# Patient Record
Sex: Male | Born: 1975 | Race: White | Hispanic: No | Marital: Single | Smoking: Never smoker
Health system: Southern US, Community
[De-identification: ages and names within clinical notes are randomized; demographics above are authoritative.]

## PROBLEM LIST (undated history)

## (undated) DIAGNOSIS — E785 Hyperlipidemia, unspecified: Secondary | ICD-10-CM

## (undated) DIAGNOSIS — R1310 Dysphagia, unspecified: Secondary | ICD-10-CM

## (undated) DIAGNOSIS — Z8249 Family history of ischemic heart disease and other diseases of the circulatory system: Secondary | ICD-10-CM

## (undated) DIAGNOSIS — J45909 Unspecified asthma, uncomplicated: Secondary | ICD-10-CM

## (undated) HISTORY — DX: Hyperlipidemia, unspecified: E78.5

## (undated) HISTORY — DX: Dysphagia, unspecified: R13.10

## (undated) HISTORY — DX: Family history of ischemic heart disease and other diseases of the circulatory system: Z82.49

## (undated) HISTORY — PX: WISDOM TOOTH EXTRACTION: SHX21

## (undated) HISTORY — DX: Unspecified asthma, uncomplicated: J45.909

---

## 2010-06-08 ENCOUNTER — Emergency Department (HOSPITAL_BASED_OUTPATIENT_CLINIC_OR_DEPARTMENT_OTHER)
Admission: EM | Admit: 2010-06-08 | Discharge: 2010-06-08 | Payer: Self-pay | Source: Home / Self Care | Admitting: Emergency Medicine

## 2016-06-01 ENCOUNTER — Telehealth: Payer: Self-pay | Admitting: Cardiology

## 2016-06-01 NOTE — Telephone Encounter (Signed)
Records received from [facility] for apt on 06/26/16 with Dr SwazilandJordan. Records filed in medical records for schedules. CN

## 2016-06-25 NOTE — Progress Notes (Signed)
Cardiology Office Note    Date:  06/26/2016   ID:  Patrick ParentsCory Economou, DOB 05/21/1976, MRN [id number]  PCP:  Lilia ArgueKAPLAN,KRISTEN, PA-C  Cardiologist:  Tallyn Holroyd [place]az[place]Jordan, MD    History of Present Illness:  Patrick Horton is a 41 y.o. male seen at the request of Mady [facility]Kristen [facility] for evaluation of family history of prolonged QT. He is in good health with only history of hypertriglyceridemia. He reports a family history of prolonged QT. This was diagnosed in his half sister with genetic testing for another condition. Since then multiple family members have been tested. ? With buccal swab. He reports his mother, brother, half sister- and her two children have tested positive. 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 He states it has never shown up on their Ecgs. There are no clinical events noted. No family history of sudden death or arrhythmia. States his mother was placed on a beta blocker but this made her feel awful. He wants to avoid medication if possible.   Past Medical History:  Diagnosis Date  . Asthma   . Asthma   . Family history of long QT syndrome   . Hyperlipidemia   . Swallowing dysfunction     Past Surgical History:  Procedure Laterality Date  . WISDOM TOOTH EXTRACTION      Current Medications: Outpatient Medications Prior to Visit  Medication Sig Dispense Refill  . albuterol (PROVENTIL HFA;VENTOLIN HFA) 108 (90 Base) MCG/ACT inhaler Inhale 2 puffs into the lungs every 4 (four) hours as needed for wheezing or shortness of breath.     No facility-administered medications prior to visit.      Allergies:   Penicillins   Social History   Social History  . Marital status: Single    Spouse name: N/A  . Number of children: N/A  . Years of education: N/A   Social History Main Topics  . Smoking status: Never Smoker  . Smokeless tobacco: Never Used  . Alcohol use None  . Drug use: Unknown  . Sexual activity: Not Asked   Other Topics Concern  . None   Social  History Narrative  . None     Family History:  The patient's family history includes Heart attack in his maternal grandfather. Positive family history for long QT.  ROS:   Please see the history of present illness.    ROS All other systems reviewed and are negative.   PHYSICAL EXAM:   VS:  BP 120/80 (BP Location: Left Arm, Patient Position: Sitting, Cuff Size: Normal)   Pulse 69   Ht 5\' 9"  (1.753 m)   Wt 178 lb 6.4 oz (80.9 kg)   BMI 26.35 kg/m    GEN: Well nourished, well developed, in no acute distress  HEENT: normal  Neck: no JVD, carotid bruits, or masses Cardiac: RRR; no murmurs, rubs, or gallops,no edema  Respiratory:  clear to auscultation bilaterally, normal work of breathing GI: soft, nontender, nondistended, + BS MS: no deformity or atrophy  Skin: warm and dry, no rash Neuro:  Alert and Oriented x 3, Strength and sensation are intact Psych: euthymic mood, full affect  Wt Readings from Last 3 Encounters:  06/26/16 178 lb 6.4 oz (80.9 kg)      Studies/Labs Reviewed:   EKG:  EKG is ordered today.  The ekg ordered today demonstrates NSR with normal Ecg. QTc of 385 msec.  Recent Labs: No results found for requested labs within last 8760 hours.  Lipid Panel No results found for: CHOL, TRIG, HDL, CHOLHDL, VLDL, LDLCALC, LDLDIRECT  Additional studies/ records that were reviewed today include:  Labs dated 05/15/16: TSH normal. Cholesterol 175, triglycerides 297, HDL 35, LDL 97. CMET and CBC normal.  ASSESSMENT:    1. Hypertriglyceridemia   2. Family history of prolonged QT   PLAN:  In order of problems listed above:    The patient would like to have genetic testing for prolonged QT. It would be helpful to know what mutation his family members have tested positive for. The patient has a normal QT on Ecg today. I have reached out to Dr. Berton Mount to give [place] some guidance on how to proceed.      Medication Adjustments/Labs and Tests Ordered: Current  medicines are reviewed at length with the patient today.  Concerns regarding medicines are outlined above.  Medication changes, Labs and Tests ordered today are listed in the Patient Instructions below. Patient Instructions  I will discuss your case with our EP and call you back about next steps for testing.      Signed, Marquez Ceesay [place], MD  06/26/2016 12:09 PM    [facility] [address] [place], [place], [address] [phone]

## 2016-06-26 ENCOUNTER — Encounter: Payer: Self-pay | Admitting: Cardiology

## 2016-06-26 ENCOUNTER — Ambulatory Visit (INDEPENDENT_AMBULATORY_CARE_PROVIDER_SITE_OTHER): Payer: BLUE CROSS/BLUE SHIELD | Admitting: Cardiology

## 2016-06-26 DIAGNOSIS — E781 Pure hyperglyceridemia: Secondary | ICD-10-CM | POA: Diagnosis not present

## 2016-06-26 NOTE — Patient Instructions (Signed)
I will discuss your case with our EP and call you back about next steps for testing.

## 2016-07-10 ENCOUNTER — Telehealth: Payer: Self-pay

## 2016-07-10 NOTE — Telephone Encounter (Signed)
Called patient no answer.LMTC. 

## 2016-07-14 ENCOUNTER — Encounter: Payer: Self-pay | Admitting: Internal Medicine

## 2016-07-21 ENCOUNTER — Telehealth: Payer: Self-pay

## 2016-07-21 NOTE — Telephone Encounter (Signed)
Spoke to BorgWarnerMelissa Tatum EP scheduler, she stated she called patient and left a message for him to call her back to schedule appointment with Dr.Klein.Patient  never returned her call.

## 2016-07-28 ENCOUNTER — Institutional Professional Consult (permissible substitution): Payer: BLUE CROSS/BLUE SHIELD | Admitting: Internal Medicine

## 2016-07-30 ENCOUNTER — Institutional Professional Consult (permissible substitution): Payer: BLUE CROSS/BLUE SHIELD | Admitting: Internal Medicine

## 2016-08-04 ENCOUNTER — Encounter (INDEPENDENT_AMBULATORY_CARE_PROVIDER_SITE_OTHER): Payer: Self-pay

## 2016-08-04 ENCOUNTER — Encounter: Payer: Self-pay | Admitting: Internal Medicine

## 2016-08-04 ENCOUNTER — Ambulatory Visit (INDEPENDENT_AMBULATORY_CARE_PROVIDER_SITE_OTHER): Payer: BLUE CROSS/BLUE SHIELD | Admitting: Internal Medicine

## 2016-08-04 VITALS — BP 118/84 | HR 65 | Ht 68.0 in | Wt 183.0 lb

## 2016-08-04 DIAGNOSIS — Z8249 Family history of ischemic heart disease and other diseases of the circulatory system: Secondary | ICD-10-CM | POA: Diagnosis not present

## 2016-08-04 NOTE — Progress Notes (Signed)
ELECTROPHYSIOLOGY CONSULT NOTE  Patient ID: Patrick Horton, MRN: [id number], DOB/AGE: [birth year] 41 y.o. Admit date: (Not on file) Date of Consult: 08/04/2016  Primary Physician: Lilia Argue Primary Cardiologist: PJ Consulting Physician PJ  Chief Complaint: Family hx of LQTS   HPI Patrick Horton is a 41 y.o. male    He apparently has a family history of prolonged QT. This was initially diagnosed in a half-sister's daughter . Subsequently mother his brother and his half brother and his half sister and her other daughter have been tested positive.They have LQT1 mutation through [facility]   The mother is taking BB  There are no attirbutable symptoms in the family The patient himslef  has had no symptomsl     Past Medical History:  Diagnosis Date  . Asthma   . Asthma   . Family history of long QT syndrome   . Hyperlipidemia   . Swallowing dysfunction       Surgical History:  Past Surgical History:  Procedure Laterality Date  . WISDOM TOOTH EXTRACTION       Home Meds: Prior to Admission medications   Medication Sig Start Date End Date Taking? Authorizing Provider  aspirin EC 81 MG tablet Take 81 mg by mouth daily.    Historical Provider, MD    Allergies:  Allergies  Allergen Reactions  . Penicillins Rash    Childhood reaction    Social History   Social History  . Marital status: Single    Spouse name: N/A  . Number of children: N/A  . Years of education: N/A   Occupational History  . Not on file.   Social History Main Topics  . Smoking status: Never Smoker  . Smokeless tobacco: Never Used  . Alcohol use Not on file  . Drug use: Unknown  . Sexual activity: Not on file   Other Topics Concern  . Not on file   Social History Narrative  . No narrative on file     Family History  Problem Relation Age of Onset  . Long QT syndrome Mother   . Heart attack Maternal Grandfather   . Long QT syndrome Brother      ROS:  Please see the history of  present illness.     All other systems reviewed and negative.    Physical Exam: Blood pressure 118/84, pulse 65, height 5\' 8"  (1.727 m), weight 183 lb (83 kg), SpO2 98 %. General: Well developed, well nourished male in no acute distress. Head: Normocephalic, atraumatic, sclera non-icteric, no xanthomas, nares are without discharge. EENT: normal  Lymph Nodes:  none Neck: Negative for carotid bruits. JVD not elevated. Back:without scoliosis kyphosis   Lungs: Clear bilaterally to auscultation without wheezes, rales, or rhonchi. Breathing is unlabored. Heart: RRR with S1 S2. No  murmur . No rubs, or gallops appreciated. Abdomen: Soft, non-tender, non-distended with normoactive bowel sounds. No hepatomegaly. No rebound/guarding. No obvious abdominal masses. Msk:  Strength and tone appear normal for age. Extremities: No clubbing or cyanosis. No  edema.  Distal pedal pulses are 2+ and equal bilaterally. Skin: Warm and Dry Neuro: Alert and oriented X 3. CN III-XII intact Grossly normal sensory and motor function . Psych:  Responds to questions appropriately with a normal affect.      Labs: Cardiac Enzymes No results for input(s): CKTOTAL, CKMB, TROPONINI in the last 72 hours. CBC No results found for: WBC, HGB, HCT, MCV, PLT PROTIME: No results for input(s): LABPROT, INR in the last  72 hours. Chemistry No results for input(s): NA, K, CL, CO2, BUN, CREATININE, CALCIUM, PROT, BILITOT, ALKPHOS, ALT, AST, GLUCOSE in the last 168 hours.  Invalid input(s): LABALBU Lipids No results found for: CHOL, HDL, LDLCALC, TRIG BNP No results found for: PROBNP Thyroid Function Tests: No results for input(s): TSH, T4TOTAL, T3FREE, THYROIDAB in the last 72 hours.  Invalid input(s): FREET3 Miscellaneous No results found for: DDIMER  Radiology/Studies:  No results found.  WRU:[id number]:Personally reviewed   sinus 71 15/08/35 Ow normal   Assessment and Plan:   Family hx LQTS  The family gene is known  and the patient desires to be tested  In the interim I have advised him of the [employer] website for both him and the extended family  I have been in touch w [facility] and we will proceed testing    Recommendations are the patient's symptomatic or asymptomatic to be treated with beta blockers  most effective or thought to be nadolol and propranolol   Sherryl MangesSteven Sevastian Witczak

## 2016-08-04 NOTE — Patient Instructions (Signed)
Medication Instructions: - Your physician recommends that you continue on your current medications as directed. Please refer to the Current Medication list given to you today.  Labwork: - none ordered  Procedures/Testing: - none ordered  Follow-Up: - we will call you when we hear back from [facility] to arrange testing for you  Any Additional Special Instructions Will Be Listed Below (If Applicable).     If you need a refill on your cardiac medications before your next appointment, please call your pharmacy.

## 2018-03-21 ENCOUNTER — Ambulatory Visit
Admission: RE | Admit: 2018-03-21 | Discharge: 2018-03-21 | Disposition: A | Discharge status: Home / Self Care | Payer: BLUE CROSS/BLUE SHIELD | Source: Ambulatory Visit | Attending: Family Medicine | Admitting: Family Medicine

## 2018-03-21 ENCOUNTER — Other Ambulatory Visit: Payer: Self-pay | Admitting: Family Medicine

## 2018-03-21 DIAGNOSIS — R52 Pain, unspecified: Secondary | ICD-10-CM

## 2019-10-22 IMAGING — CR DG CHEST 2V
2 series · 2 of 2 positions shown · non-contrast
Comparison: None.

CLINICAL DATA: Right chest pain x6 months

EXAM:
CHEST - 2 VIEW

[w chest pa]
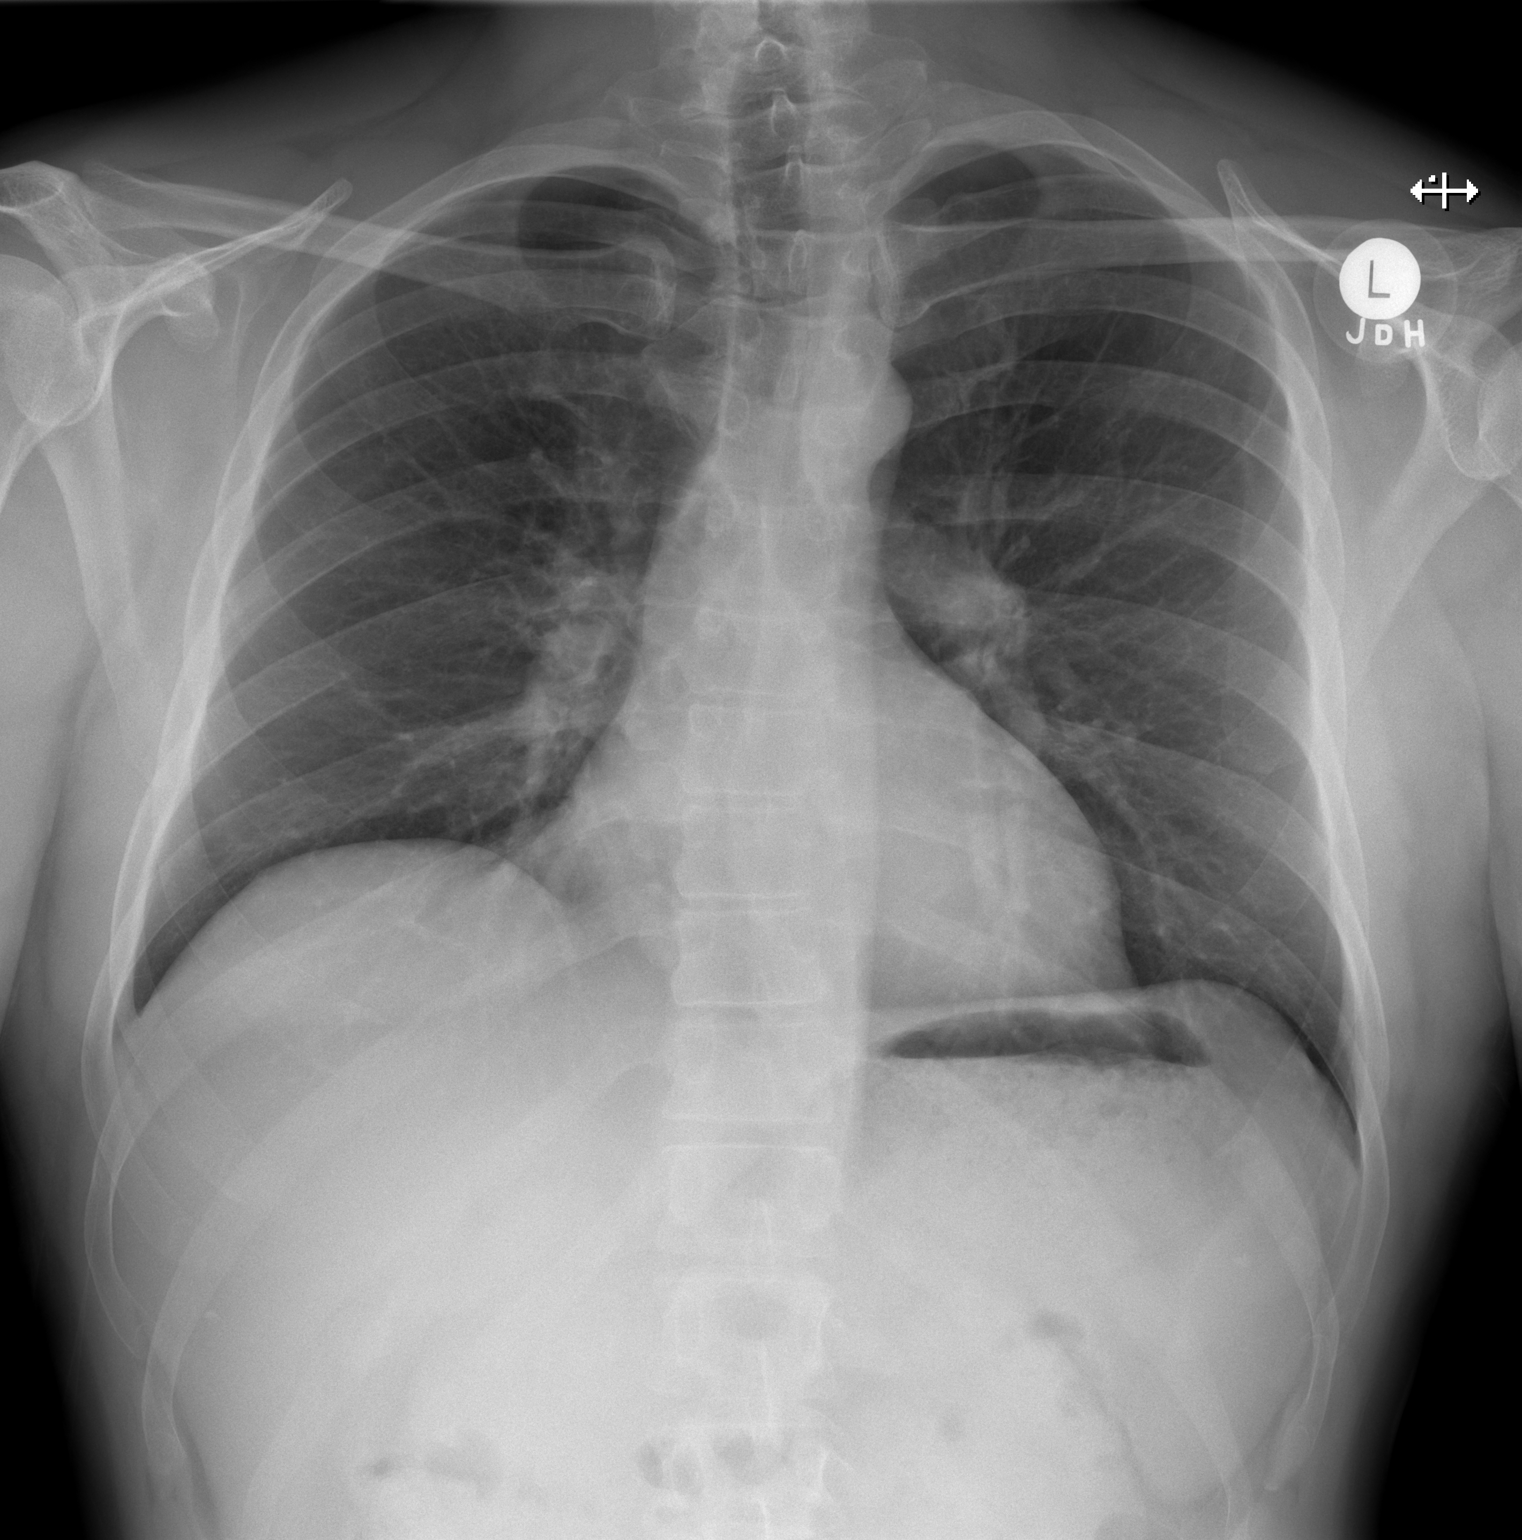

[w chest lat]
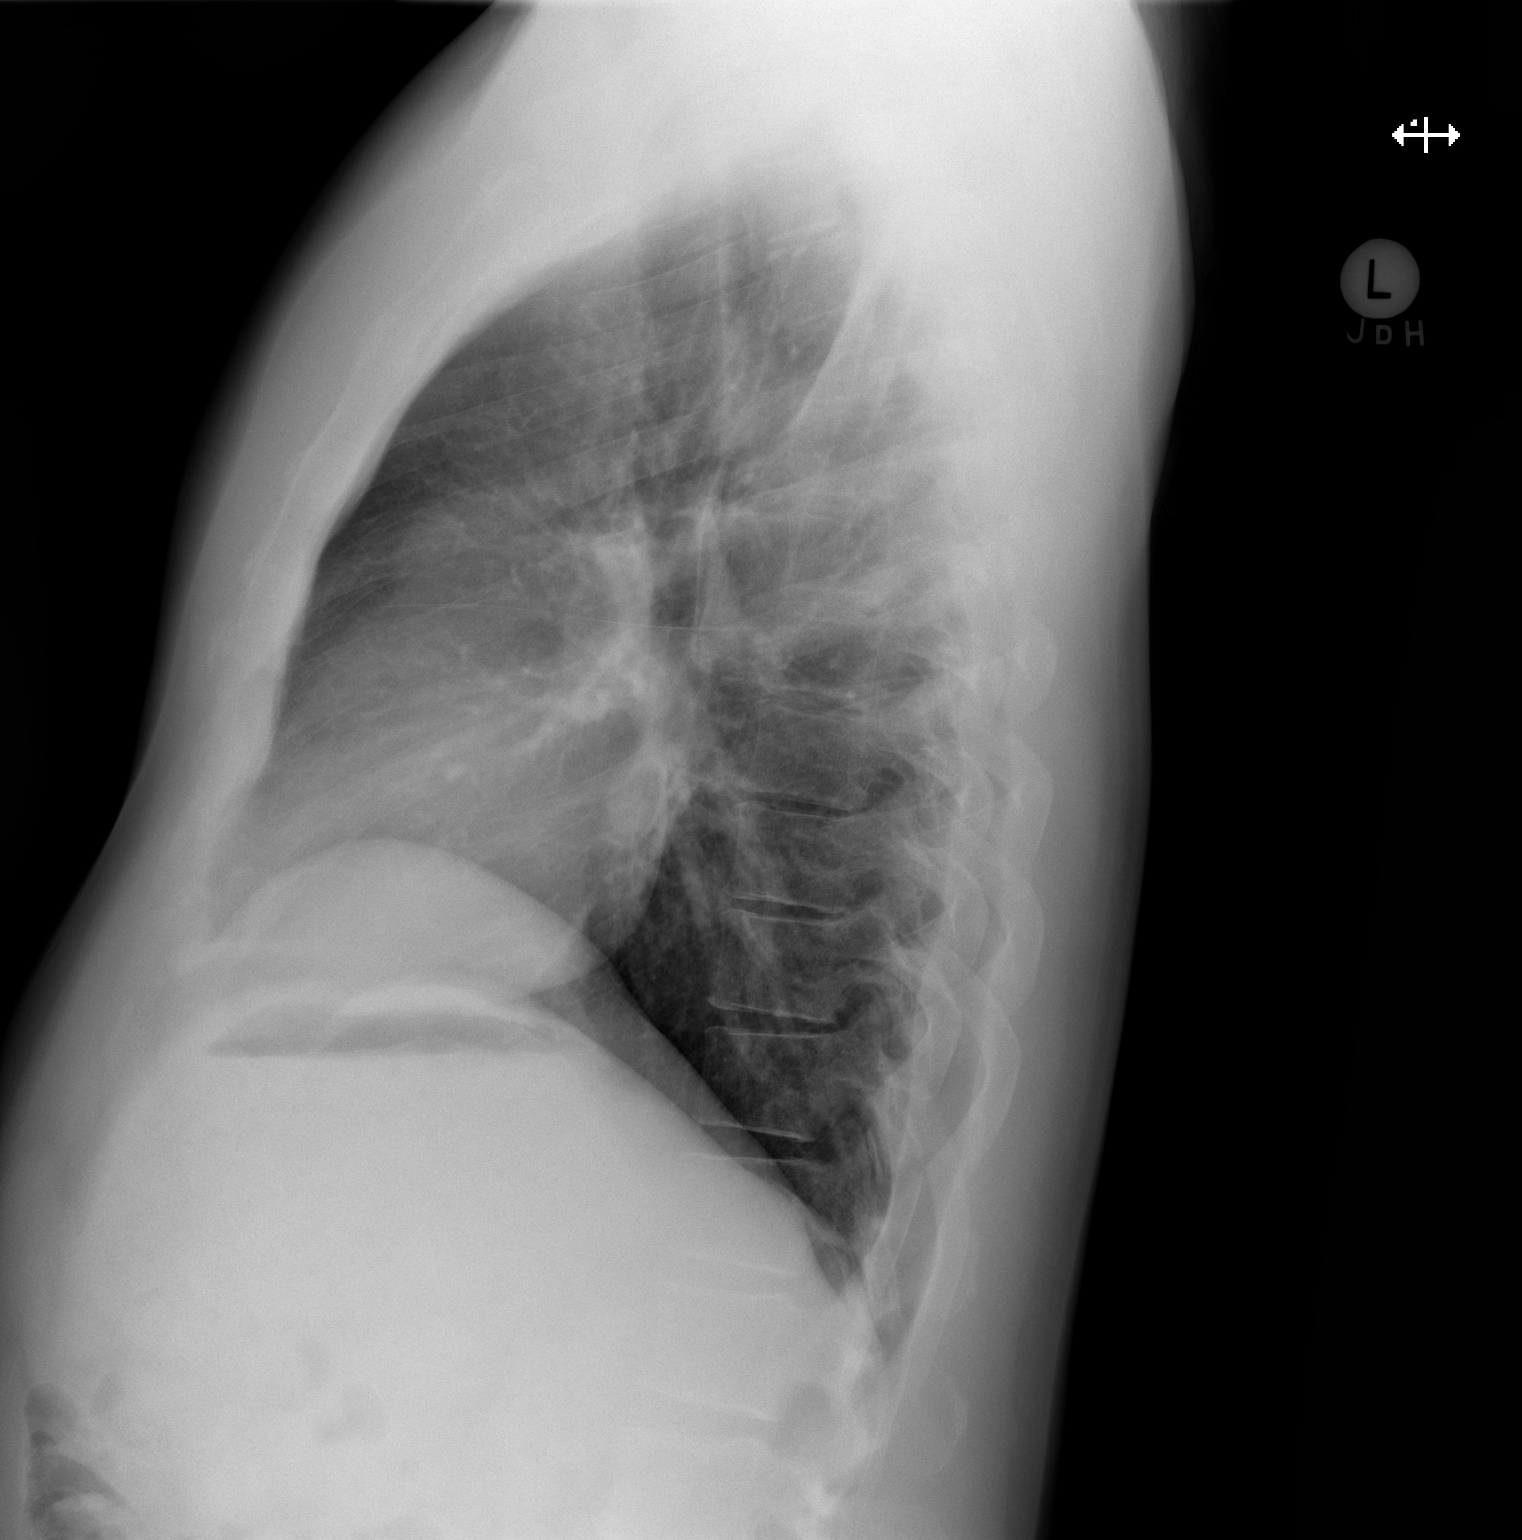

[2 of 2 positions shown; findings below may reference images not displayed]

FINDINGS: Lungs are clear.  No pleural effusion or pneumothorax.

The heart is normal in size.

Visualized osseous structures are within normal limits.
IMPRESSION: Normal chest radiographs.

## 2022-11-10 ENCOUNTER — Ambulatory Visit (INDEPENDENT_AMBULATORY_CARE_PROVIDER_SITE_OTHER): Payer: BC Managed Care – PPO | Admitting: Orthopaedic Surgery

## 2022-11-10 DIAGNOSIS — S76312A Strain of muscle, fascia and tendon of the posterior muscle group at thigh level, left thigh, initial encounter: Secondary | ICD-10-CM | POA: Diagnosis not present

## 2022-11-10 NOTE — Progress Notes (Signed)
Office Visit Note   Patient: Patrick Horton           Date of Birth: March 24, 1976           MRN: [id number] Visit Date: 11/10/2022              Requested by: Patrick Horton., PA-C [address]  [place] [address] PCP: Patrick Horton., PA-C   Assessment & Plan: Visit Diagnoses:  1. Left proximal hamstring tendon rupture, initial encounter     Plan: Impression is left proximal hamstring rupture.  The MRI scan and the report were both reviewed in detail.  He has a 3 cm avulsion of the conjoined tendon 30% tear of the semimembranosus.  He is doing very well clinically.  Based on findings I recommended nonoperative treatment.  He is allowed to do daily activities for the next 6 weeks.  He will be careful and modify activity as tolerated.  Work note with restrictions provided for 3 months.  I would like to recheck him in 6 weeks.  Follow-Up Instructions: Return in about 6 weeks (around 12/22/2022).   Orders:  No orders of the defined types were placed in this encounter.  No orders of the defined types were placed in this encounter.     Procedures: No procedures performed   Clinical Data: No additional findings.   Subjective: Chief Complaint  Patient presents with   Left Leg - Pain    HPI Patrick Horton is a 47 year old gentleman comes in for evaluation of acute left proximal hamstring rupture that occurred this past Sunday while playing tennis.  He was unable to complete the match.  He felt pain in the buttock area and fell to the ground.  He was initially evaluated by Patrick Horton at [facility] and an MRI confirmed proximal hamstring rupture.  He describes a dull ache and a slight limp with ambulation.  Denies any numbness and tingling.  He has been taking Aleve and Advil. Review of Systems  Constitutional: Negative.   HENT: Negative.    Eyes: Negative.   Respiratory: Negative.    Cardiovascular: Negative.   Gastrointestinal: Negative.   Endocrine: Negative.    Genitourinary: Negative.   Skin: Negative.   Allergic/Immunologic: Negative.   Neurological: Negative.   Hematological: Negative.   Psychiatric/Behavioral: Negative.    All other systems reviewed and are negative.    Objective: Vital Signs: There were no vitals taken for this visit.  Physical Exam Vitals and nursing note reviewed.  Constitutional:      Appearance: He is well-developed.  HENT:     Head: Normocephalic and atraumatic.  Eyes:     Pupils: Pupils are equal, round, and reactive to light.  Pulmonary:     Effort: Pulmonary effort is normal.  Abdominal:     Palpations: Abdomen is soft.  Musculoskeletal:        General: Normal range of motion.     Cervical back: Neck supple.  Skin:    General: Skin is warm.  Neurological:     Mental Status: He is alert and oriented to person, place, and time.  Psychiatric:        Behavior: Behavior normal.        Thought Content: Thought content normal.        Judgment: Judgment normal.     Ortho Exam Examination of the left lower extremity shows that he is able to flex his knee against gravity to about 30 degrees without pain from the  prone position.  There is very light bruising to the popliteal fossa.  No neurovascular compromise to the distal extremity.  He has mild discomfort with palpation towards the proximal hamstring.  Hip range of motion is well-tolerated.  Overall he is able to walk without any assistive devices and he has a minimal limp that is almost imperceptible. Specialty Comments:  No specialty comments available.  Imaging: No results found.   PMFS History: Patient Active Problem List   Diagnosis Date Noted   Left proximal hamstring tendon rupture, initial encounter 11/10/2022   Hypertriglyceridemia 06/26/2016   Past Medical History:  Diagnosis Date   Asthma    Asthma    Family history of long QT syndrome    Hyperlipidemia    Swallowing dysfunction     Family History  Problem Relation Age of  Onset   Long QT syndrome Mother    Heart attack Maternal Grandfather    Long QT syndrome Brother     Past Surgical History:  Procedure Laterality Date   WISDOM TOOTH EXTRACTION     Social History   Occupational History   Not on file  Tobacco Use   Smoking status: Never   Smokeless tobacco: Never  Substance and Sexual Activity   Alcohol use: Not on file   Drug use: Not on file   Sexual activity: Not on file

## 2022-12-24 ENCOUNTER — Ambulatory Visit (INDEPENDENT_AMBULATORY_CARE_PROVIDER_SITE_OTHER): Payer: BC Managed Care – PPO | Admitting: Orthopaedic Surgery

## 2022-12-24 DIAGNOSIS — S76312A Strain of muscle, fascia and tendon of the posterior muscle group at thigh level, left thigh, initial encounter: Secondary | ICD-10-CM

## 2022-12-24 NOTE — Progress Notes (Signed)
   Office Visit Note   Patient: Patrick Horton           Date of Birth: [birth year]           MRN: [id number] Visit Date: 12/24/2022              Requested by: Richmond Campbell., PA-C [address]  [place] [address] PCP: Richmond Campbell., PA-C   Assessment & Plan: Visit Diagnoses:  1. Left proximal hamstring tendon rupture, initial encounter     Plan: Patrick Horton is recovering well from the injury.  He is about 6 to 7 weeks.  Clinically very happy with his progress.  At this point I will make referral to the [facility] for stretching and strengthening and gait training.  Recheck in 6 weeks.  Follow-Up Instructions: Return in about 6 weeks (around 02/04/2023).   Orders:  Orders Placed This Encounter  Procedures   Ambulatory referral to Physical Therapy   No orders of the defined types were placed in this encounter.     Procedures: No procedures performed   Clinical Data: No additional findings.   Subjective: Chief Complaint  Patient presents with   Left Leg - Follow-up    HPI Patrick Horton is now 7 weeks status post left proximal hamstring rupture.  He reports that overall he is doing much better.  He is able to sit longer and has been able to increase activity without significant difficulty.  He has been able to cross city street quickly. Review of Systems   Objective: Vital Signs: There were no vitals taken for this visit.  Physical Exam  Ortho Exam Examination of the left thigh shows no tenderness to the proximal hamstrings over the ischial tuberosity.  He has expected stiffness of the hamstring.  Normal gait pattern. Specialty Comments:  No specialty comments available.  Imaging: No results found.   PMFS History: Patient Active Problem List   Diagnosis Date Noted   Left proximal hamstring tendon rupture, initial encounter 11/10/2022   Hypertriglyceridemia 06/26/2016   Past Medical History:  Diagnosis Date   Asthma    Asthma    Family  history of long QT syndrome    Hyperlipidemia    Swallowing dysfunction     Family History  Problem Relation Age of Onset   Long QT syndrome Mother    Heart attack Maternal Grandfather    Long QT syndrome Brother     Past Surgical History:  Procedure Laterality Date   WISDOM TOOTH EXTRACTION     Social History   Occupational History   Not on file  Tobacco Use   Smoking status: Never   Smokeless tobacco: Never  Substance and Sexual Activity   Alcohol use: Not on file   Drug use: Not on file   Sexual activity: Not on file

## 2022-12-31 ENCOUNTER — Other Ambulatory Visit: Payer: Self-pay

## 2022-12-31 ENCOUNTER — Encounter (HOSPITAL_BASED_OUTPATIENT_CLINIC_OR_DEPARTMENT_OTHER): Payer: Self-pay | Admitting: Physical Therapy

## 2022-12-31 ENCOUNTER — Ambulatory Visit (HOSPITAL_BASED_OUTPATIENT_CLINIC_OR_DEPARTMENT_OTHER): Payer: BC Managed Care – PPO | Attending: Orthopaedic Surgery | Admitting: Physical Therapy

## 2022-12-31 DIAGNOSIS — M79605 Pain in left leg: Secondary | ICD-10-CM | POA: Diagnosis present

## 2022-12-31 DIAGNOSIS — S76312A Strain of muscle, fascia and tendon of the posterior muscle group at thigh level, left thigh, initial encounter: Secondary | ICD-10-CM | POA: Diagnosis not present

## 2022-12-31 DIAGNOSIS — R2689 Other abnormalities of gait and mobility: Secondary | ICD-10-CM | POA: Diagnosis present

## 2022-12-31 NOTE — Therapy (Signed)
OUTPATIENT PHYSICAL THERAPY LOWER EXTREMITY EVALUATION   Patient Name: Patrick Horton MRN: [id number] DOB:1975-07-29, 47 y.o., male Today's Date: 01/01/2023  END OF SESSION:  PT End of Session - 12/31/22 1539     Visit Number 1    Number of Visits 8    Date for PT Re-Evaluation 02/25/23    PT Start Time 1430    PT Stop Time 1515    PT Time Calculation (min) 45 min             Past Medical History:  Diagnosis Date   Asthma    Asthma    Family history of long QT syndrome    Hyperlipidemia    Swallowing dysfunction    Past Surgical History:  Procedure Laterality Date   WISDOM TOOTH EXTRACTION     Patient Active Problem List   Diagnosis Date Noted   Left proximal hamstring tendon rupture, initial encounter 11/10/2022   Hypertriglyceridemia 06/26/2016    PCP: Dr Mady Gemma   REFERRING PROVIDER: Dr Gershon Mussel   REFERRING DIAG:  Diagnosis  [id number] (ICD-10-CM) - Left proximal hamstring tendon rupture, initial encounter    THERAPY DIAG:  No diagnosis found.  Rationale for Evaluation and Treatment: Rehabilitation  ONSET DATE: aprox 8 weeks ago   SUBJECTIVE:   SUBJECTIVE STATEMENT: Patient was playing tennis approximately 8 weeks ago when he felt a pop in his proximal head of the hamstrings.  He had significant pain at that time.  For a period of time he had difficulty sitting standing and walking.  He is active prior to his injury.  He enjoys tennis and golf.  He also did a light jogging.  He feels like his ability to walk is improved.  He is also sitting better without pain.  Over the past 2 weeks or so he is only experienced about a 1-2 out of 10 pain.  He would like to progress back into normal activity.  He would also like to avoid surgery if able.  PERTINENT HISTORY: Bulging disc in the back  PAIN:  Are you having pain? Yes: NPRS scale: 1-2/10 Pain location: left gluteal  Pain description: dull aching  Aggravating factors: sitting for too long Relieving  factors: changing position   PRECAUTIONS: None  RED FLAGS: None   WEIGHT BEARING RESTRICTIONS: No  FALLS:  Has patient fallen in last 6 months? No  LIVING ENVIRONMENT: No steps into the house  OCCUPATION:  Insurance underwriter: mostly desk work for now  NIKE:   Tennis , golf, [employer]     PLOF: Independent  PATIENT GOALS:   NEXT MD VISIT:    OBJECTIVE:   DIAGNOSTIC FINDINGS:    PATIENT SURVEYS:  FOTO    COGNITION: Overall cognitive status: Within functional limits for tasks assessed     SENSATION: Occasional tingling   EDEMA:    MUSCLE LENGTH: Hamstrings: Right 20 deg from straight 90/90; Left 43 from straight deg   POSTURE: No Significant postural limitations  PALPATION: Mild TTP in the gluteal   LOWER EXTREMITY ROM:  Passive ROM Right eval Left eval  Hip flexion  Within normal limits  Hip extension    Hip abduction    Hip adduction    Hip internal rotation  Within normal limits  Hip external rotation  Within normal limits  Knee flexion    Knee extension    Ankle dorsiflexion    Ankle plantarflexion    Ankle inversion    Ankle eversion     (Blank  rows = not tested)  LOWER EXTREMITY MMT:  MMT Right eval Left eval  Hip flexion 40.7 38.8  Hip extension    Hip abduction 45.6 47.7  Hip adduction    Hip internal rotation    Hip external rotation    Knee flexion  Not tested but able to put pressure into therapist hand without pain  Knee extension Within normal limits Within normal limits  Ankle dorsiflexion    Ankle plantarflexion    Ankle inversion    Ankle eversion     (Blank rows = not tested)   FUNCTIONAL TESTS:  Squat   GAIT: No significant gait deviations noted.  Patient does have pain when he walks too far.  TODAY'S TREATMENT:                                                                                                                              DATE:   Exercises - Supine Bridge  - 1 x daily - 7 x weekly -  3 sets - 10 reps - Prone Hip Extension  - 1 x daily - 7 x weekly - 3 sets - 10 reps - Side Stepping/ Forward stepping with band   - 1 x daily - 7 x weekly - 3 sets - 10 reps  PATIENT EDUCATION:  Education details: HEP , symptom management,  Person educated: Patient Education method: Explanation, Demonstration, Tactile cues, Verbal cues, and Handouts Education comprehension: verbalized understanding, returned demonstration, verbal cues required, tactile cues required, and needs further education  HOME EXERCISE PROGRAM: Access Code: [email or web address] URL: [email or web address] Date: 01/01/2023 Prepared by: Lorayne Bender  ASSESSMENT:  CLINICAL IMPRESSION: Patient is a 47 year old male who suffered a proximal left hamstring tear approximately 8 weeks prior.  He has felt a progressive improvement in pain since that point.  He enjoys playing tennis, golf and jogging.  He presents with limitations in hamstring length, strength, and general functional mobility.  He has mild tenderness to palpation in the hamstring insertion.  His MRI shows a 30% tear of one of the hamstring tendons and a complete tear of the other with a 3 cm retraction.  He was given a light loading program today.  We will assess his tolerance.  He had no pain with his current exercise program today.  We will progress him as tolerated.  He will benefit from skilled therapy to return to active lifestyle. OBJECTIVE IMPAIRMENTS: decreased activity tolerance, difficulty walking, decreased ROM, decreased strength, impaired flexibility, and pain.   ACTIVITY LIMITATIONS: carrying, bending, standing, stairs, transfers, and locomotion level  PARTICIPATION LIMITATIONS: community activity, occupation, and tennis and golf   PERSONAL FACTORS: None  REHAB POTENTIAL: Excellent  CLINICAL DECISION MAKING: Stable/uncomplicated  EVALUATION COMPLEXITY: Low   GOALS: Goals reviewed with patient? Yes  SHORT TERM GOALS: Target date:  01/28/2023   Patient will demonstrate equal 90/90 hamstring length L V R  Baseline: Goal status: INITIAL  2.  Patient will demonstrate equal  left and right LE strength  Baseline:  Goal status: INITIAL  3.  Patient will be independent with base program  Baseline:  Goal status: INITIAL   LONG TERM GOALS: Target date: 02/25/2023    Patient will return to tennis  Baseline:  Goal status: INITIAL  2.  Patient will ambulate community distances without pain and weaknes  Baseline:  Goal status: INITIAL  3.  Patient will return to jogging  Baseline:  Goal status: INITIAL    PLAN:  PT FREQUENCY: 1x/week  PT DURATION: 6 weeks  PLANNED INTERVENTIONS: Therapeutic exercises, Therapeutic activity, Neuromuscular re-education, Balance training, Gait training, Patient/Family education, Self Care, Joint mobilization, Stair training, DME instructions, Aquatic Therapy, Dry Needling, Electrical stimulation, Cryotherapy, Moist heat, Taping, Manual therapy, and Re-evaluation.   PLAN FOR NEXT SESSION:  Review exercises see how patient did.  Consider single-leg stability drills.  Consider cone drill, consider 2 inch eccentric step down, consider exercise ball supine isometric, consider double knee-to-chest with the exercise ball.  Manual therapy to the proximal hamstring if needed.  Progress bridging if tolerated.  Consider leg press if patient is able to tolerate  Dessie Coma, PT 01/01/2023, 1:57 PM

## 2023-01-20 ENCOUNTER — Ambulatory Visit (HOSPITAL_BASED_OUTPATIENT_CLINIC_OR_DEPARTMENT_OTHER): Payer: BC Managed Care – PPO | Admitting: Physical Therapy

## 2023-01-27 ENCOUNTER — Ambulatory Visit (HOSPITAL_BASED_OUTPATIENT_CLINIC_OR_DEPARTMENT_OTHER): Payer: BC Managed Care – PPO | Attending: Orthopaedic Surgery | Admitting: Physical Therapy

## 2023-01-27 ENCOUNTER — Encounter (HOSPITAL_BASED_OUTPATIENT_CLINIC_OR_DEPARTMENT_OTHER): Payer: Self-pay | Admitting: Physical Therapy

## 2023-01-27 DIAGNOSIS — M79605 Pain in left leg: Secondary | ICD-10-CM | POA: Insufficient documentation

## 2023-01-27 DIAGNOSIS — R2689 Other abnormalities of gait and mobility: Secondary | ICD-10-CM | POA: Diagnosis present

## 2023-01-27 NOTE — Therapy (Signed)
OUTPATIENT PHYSICAL THERAPY LOWER EXTREMITY EVALUATION   Patient Name: Patrick Horton MRN: [id number] DOB:January 15, 1976, 47 y.o., male Today's Date: 01/27/2023  END OF SESSION:  PT End of Session - 01/27/23 0857     Visit Number 2    Number of Visits 8    Date for PT Re-Evaluation 02/25/23    PT Start Time 0845    PT Stop Time 0928    PT Time Calculation (min) 43 min    Activity Tolerance Patient tolerated treatment well    Behavior During Therapy [facility] for tasks assessed/performed              Past Medical History:  Diagnosis Date   Asthma    Asthma    Family history of long QT syndrome    Hyperlipidemia    Swallowing dysfunction    Past Surgical History:  Procedure Laterality Date   WISDOM TOOTH EXTRACTION     Patient Active Problem List   Diagnosis Date Noted   Left proximal hamstring tendon rupture, initial encounter 11/10/2022   Hypertriglyceridemia 06/26/2016    PCP: Dr Mady Gemma   REFERRING PROVIDER: Dr Gershon Mussel   REFERRING DIAG:  Diagnosis  [phone] (ICD-10-CM) - Left proximal hamstring tendon rupture, initial encounter    THERAPY DIAG:  Pain in left leg  Other abnormalities of gait and mobility  Rationale for Evaluation and Treatment: Rehabilitation  ONSET DATE: aprox 8 weeks ago   SUBJECTIVE:   SUBJECTIVE STATEMENT: Patient reports he is doing well with his exercises.  After about a week he decided to start running.  He reports that after that he had significant increase in pain in his proximal hamstring in his mid belly of his hamstring.  He stopped running immediately.  Therapy had not advised him to start running at this time.  Eval:Patient was playing tennis approximately 8 weeks ago when he felt a pop in his proximal head of the hamstrings.  He had significant pain at that time.  For a period of time he had difficulty sitting standing and walking.  He is active prior to his injury.  He enjoys tennis and golf.  He also did a light jogging.   He feels like his ability to walk is improved.  He is also sitting better without pain.  Over the past 2 weeks or so he is only experienced about a 1-2 out of 10 pain.  He would like to progress back into normal activity.  He would also like to avoid surgery if able.  PERTINENT HISTORY: Bulging disc in the back  PAIN:  Are you having pain? Yes: NPRS scale: 1-2/10 Pain location: left gluteal  Pain description: dull aching  Aggravating factors: sitting for too long Relieving factors: changing position   PRECAUTIONS: None  RED FLAGS: None   WEIGHT BEARING RESTRICTIONS: No  FALLS:  Has patient fallen in last 6 months? No  LIVING ENVIRONMENT: No steps into the house  OCCUPATION:  Insurance underwriter: mostly desk work for now  NIKE:   Tennis , golf, [employer]     PLOF: Independent  PATIENT GOALS:   NEXT MD VISIT:    OBJECTIVE:   DIAGNOSTIC FINDINGS:    PATIENT SURVEYS:  FOTO    COGNITION: Overall cognitive status: Within functional limits for tasks assessed     SENSATION: Occasional tingling   EDEMA:    MUSCLE LENGTH: Hamstrings: Right 20 deg from straight 90/90; Left 43 from straight deg   POSTURE: No Significant postural limitations  PALPATION:  Mild TTP in the gluteal   LOWER EXTREMITY ROM:  Passive ROM Right eval Left eval  Hip flexion  Within normal limits  Hip extension    Hip abduction    Hip adduction    Hip internal rotation  Within normal limits  Hip external rotation  Within normal limits  Knee flexion    Knee extension    Ankle dorsiflexion    Ankle plantarflexion    Ankle inversion    Ankle eversion     (Blank rows = not tested)  LOWER EXTREMITY MMT:  MMT Right eval Left eval  Hip flexion 40.7 38.8  Hip extension    Hip abduction 45.6 47.7  Hip adduction    Hip internal rotation    Hip external rotation    Knee flexion  Not tested but able to put pressure into therapist hand without pain  Knee extension Within  normal limits Within normal limits  Ankle dorsiflexion    Ankle plantarflexion    Ankle inversion    Ankle eversion     (Blank rows = not tested)   FUNCTIONAL TESTS:  Squat   GAIT: No significant gait deviations noted.  Patient does have pain when he walks too far.  TODAY'S TREATMENT:                                                                                                                              DATE:  9/4 Manual: Trigger point release to mid belly of medial hamstring.  Trigger point release to proximal hamstring.  Trigger Point Dry-Needling  Treatment instructions: Expect mild to moderate muscle soreness. S/S of pneumothorax if dry needled over a lung field, and to seek immediate medical attention should they occur. Patient verbalized understanding of these instructions and education.  Patient Consent Given: Yes Education handout provided: Yes Muscles treated: Right lateral gastroc using a 0.30 x 50   medial mid belly of hamstring 0.30 X50 2 spots Electrical stimulation performed: No Parameters: N/A Treatment response/outcome: Excellent twitch in all spots, immediate improvement in pain and gastroc  Start drill with low range perturbations 2 x 10 Cone drill 2 x 10 each leg.  Patient advised to keep knee locked continue with the current exercises until progress by therapy   Eval   Exercises - Supine Bridge  - 1 x daily - 7 x weekly - 3 sets - 10 reps - Prone Hip Extension  - 1 x daily - 7 x weekly - 3 sets - 10 reps - Side Stepping/ Forward stepping with band   - 1 x daily - 7 x weekly - 3 sets - 10 reps  PATIENT EDUCATION:  Education details: HEP , symptom management,  Person educated: Patient Education method: Explanation, Demonstration, Tactile cues, Verbal cues, and Handouts Education comprehension: verbalized understanding, returned demonstration, verbal cues required, tactile cues required, and needs further education  HOME EXERCISE PROGRAM: Access  Code: [email or web address] URL: [email or web address] Date: 01/01/2023 Prepared  by: Lorayne Bender  ASSESSMENT:  CLINICAL IMPRESSION: Patient advised again not to run at this time.  The MD felt it would be close to a year before he is back to running.  We reviewed the timeframe of healing with the patient.  Tenderness to palpation in the mid belly of the hamstring.  He also had some pain in his right lateral gastroc.  He has significant improvement both with needling.  We advance to some single-leg stability exercises.  He was advised to listen to his leg and not progressed to fast.  Therapy will reassess next week and continue to progress as tolerated.  Eval:Patient is a 47 year old male who suffered a proximal left hamstring tear approximately 8 weeks prior.  He has felt a progressive improvement in pain since that point.  He enjoys playing tennis, golf and jogging.  He presents with limitations in hamstring length, strength, and general functional mobility.  He has mild tenderness to palpation in the hamstring insertion.  His MRI shows a 30% tear of one of the hamstring tendons and a complete tear of the other with a 3 cm retraction.  He was given a light loading program today.  We will assess his tolerance.  He had no pain with his current exercise program today.  We will progress him as tolerated.  He will benefit from skilled therapy to return to active lifestyle. OBJECTIVE IMPAIRMENTS: decreased activity tolerance, difficulty walking, decreased ROM, decreased strength, impaired flexibility, and pain.   ACTIVITY LIMITATIONS: carrying, bending, standing, stairs, transfers, and locomotion level  PARTICIPATION LIMITATIONS: community activity, occupation, and tennis and golf   PERSONAL FACTORS: None  REHAB POTENTIAL: Excellent  CLINICAL DECISION MAKING: Stable/uncomplicated  EVALUATION COMPLEXITY: Low   GOALS: Goals reviewed with patient? Yes  SHORT TERM GOALS: Target date:  01/28/2023   Patient will demonstrate equal 90/90 hamstring length L V R  Baseline: Goal status: INITIAL  2.  Patient will demonstrate equal left and right LE strength  Baseline:  Goal status: INITIAL  3.  Patient will be independent with base program  Baseline:  Goal status: INITIAL   LONG TERM GOALS: Target date: 02/25/2023    Patient will return to tennis  Baseline:  Goal status: INITIAL  2.  Patient will ambulate community distances without pain and weaknes  Baseline:  Goal status: INITIAL  3.  Patient will return to jogging  Baseline:  Goal status: INITIAL    PLAN:  PT FREQUENCY: 1x/week  PT DURATION: 6 weeks  PLANNED INTERVENTIONS: Therapeutic exercises, Therapeutic activity, Neuromuscular re-education, Balance training, Gait training, Patient/Family education, Self Care, Joint mobilization, Stair training, DME instructions, Aquatic Therapy, Dry Needling, Electrical stimulation, Cryotherapy, Moist heat, Taping, Manual therapy, and Re-evaluation.   PLAN FOR NEXT SESSION:  Review exercises see how patient did.  Consider single-leg stability drills.  Consider cone drill, consider 2 inch eccentric step down, consider exercise ball supine isometric, consider double knee-to-chest with the exercise ball.  Manual therapy to the proximal hamstring if needed.  Progress bridging if tolerated.  Consider leg press if patient is able to tolerate  Dessie Coma, PT 01/27/2023, 1:18 PM

## 2023-02-03 ENCOUNTER — Encounter (HOSPITAL_BASED_OUTPATIENT_CLINIC_OR_DEPARTMENT_OTHER): Payer: Self-pay | Admitting: Physical Therapy

## 2023-02-03 ENCOUNTER — Ambulatory Visit (HOSPITAL_BASED_OUTPATIENT_CLINIC_OR_DEPARTMENT_OTHER): Payer: BC Managed Care – PPO | Admitting: Physical Therapy

## 2023-02-03 DIAGNOSIS — M79605 Pain in left leg: Secondary | ICD-10-CM | POA: Diagnosis not present

## 2023-02-03 DIAGNOSIS — R2689 Other abnormalities of gait and mobility: Secondary | ICD-10-CM

## 2023-02-03 NOTE — Therapy (Unsigned)
OUTPATIENT PHYSICAL THERAPY LOWER EXTREMITY EVALUATION   Patient Name: Patrick Horton MRN: [id number] DOB:[birth year], 47 y.o., male Today's Date: 02/03/2023  END OF SESSION:  PT End of Session - 02/03/23 1611     Visit Number 3    Number of Visits 8    Date for PT Re-Evaluation 02/25/23    PT Start Time 0405    PT Stop Time 0450    PT Time Calculation (min) 45 min    Activity Tolerance Patient tolerated treatment well    Behavior During Therapy [facility] for tasks assessed/performed              Past Medical History:  Diagnosis Date   Asthma    Asthma    Family history of long QT syndrome    Hyperlipidemia    Swallowing dysfunction    Past Surgical History:  Procedure Laterality Date   WISDOM TOOTH EXTRACTION     Patient Active Problem List   Diagnosis Date Noted   Left proximal hamstring tendon rupture, initial encounter 11/10/2022   Hypertriglyceridemia 06/26/2016    PCP: Dr Mady Gemma   REFERRING PROVIDER: Dr Gershon Mussel   REFERRING DIAG:  Diagnosis  [phone] (ICD-10-CM) - Left proximal hamstring tendon rupture, initial encounter    THERAPY DIAG:  Pain in left leg  Other abnormalities of gait and mobility  Rationale for Evaluation and Treatment: Rehabilitation  ONSET DATE: aprox 8 weeks ago   SUBJECTIVE:   SUBJECTIVE STATEMENT: The patient reports the mid belly pain has resolved. He has an occasional pinch up high but otherwise he is doing well. He is walking his dog for a few miles without isssue. No pain at his time.    Eval:Patient was playing tennis approximately 8 weeks ago when he felt a pop in his proximal head of the hamstrings.  He had significant pain at that time.  For a period of time he had difficulty sitting standing and walking.  He is active prior to his injury.  He enjoys tennis and golf.  He also did a light jogging.  He feels like his ability to walk is improved.  He is also sitting better without pain.  Over the past 2 weeks or so he  is only experienced about a 1-2 out of 10 pain.  He would like to progress back into normal activity.  He would also like to avoid surgery if able.  PERTINENT HISTORY: Bulging disc in the back  PAIN:  Are you having pain? Yes: NPRS scale: 1-2/10 " a pinch at times"  Pain location: left gluteal  Pain description: dull aching  Aggravating factors: sitting for too long Relieving factors: changing position   PRECAUTIONS: None  RED FLAGS: None   WEIGHT BEARING RESTRICTIONS: No  FALLS:  Has patient fallen in last 6 months? No  LIVING ENVIRONMENT: No steps into the house  OCCUPATION:  Insurance underwriter: mostly desk work for now  NIKE:   Tennis , golf, [employer]     PLOF: Independent  PATIENT GOALS:   NEXT MD VISIT:    OBJECTIVE:   DIAGNOSTIC FINDINGS:    PATIENT SURVEYS:  FOTO    COGNITION: Overall cognitive status: Within functional limits for tasks assessed     SENSATION: Occasional tingling   EDEMA:    MUSCLE LENGTH: Hamstrings: Right 20 deg from straight 90/90; Left 43 from straight deg   POSTURE: No Significant postural limitations  PALPATION: Mild TTP in the gluteal   LOWER EXTREMITY ROM:  Passive ROM  Right eval Left eval  Hip flexion  Within normal limits  Hip extension    Hip abduction    Hip adduction    Hip internal rotation  Within normal limits  Hip external rotation  Within normal limits  Knee flexion    Knee extension    Ankle dorsiflexion    Ankle plantarflexion    Ankle inversion    Ankle eversion     (Blank rows = not tested)  LOWER EXTREMITY MMT:  MMT Right eval Left eval  Hip flexion 40.7 38.8  Hip extension    Hip abduction 45.6 47.7  Hip adduction    Hip internal rotation    Hip external rotation    Knee flexion  Not tested but able to put pressure into therapist hand without pain  Knee extension Within normal limits Within normal limits  Ankle dorsiflexion    Ankle plantarflexion    Ankle inversion     Ankle eversion     (Blank rows = not tested)   FUNCTIONAL TESTS:  Squat   GAIT: No significant gait deviations noted.  Patient does have pain when he walks too far.  TODAY'S TREATMENT:                                                                                                                              DATE:  Ex bike L8   Ecx ball Hamstring iso with cuing not to push into a pinch 2x10  DKTC with care not to irritate hamstring insertion 2x10   Goblet squat 6 lbs in low range 3x15  DB lunge 6 lbs each hand slow static lunge with monitoring of hamstring 2x10   LAQ 3x15 5 lbs     9/4 Manual: Trigger point release to mid belly of medial hamstring.  Trigger point release to proximal hamstring.  Trigger Point Dry-Needling  Treatment instructions: Expect mild to moderate muscle soreness. S/S of pneumothorax if dry needled over a lung field, and to seek immediate medical attention should they occur. Patient verbalized understanding of these instructions and education.  Patient Consent Given: Yes Education handout provided: Yes Muscles treated: Right lateral gastroc using a 0.30 x 50   medial mid belly of hamstring 0.30 X50 2 spots Electrical stimulation performed: No Parameters: N/A Treatment response/outcome: Excellent twitch in all spots, immediate improvement in pain and gastroc  Start drill with low range perturbations 2 x 10 Cone drill 2 x 10 each leg.  Patient advised to keep knee locked continue with the current exercises until progress by therapy   Eval   Exercises - Supine Bridge  - 1 x daily - 7 x weekly - 3 sets - 10 reps - Prone Hip Extension  - 1 x daily - 7 x weekly - 3 sets - 10 reps - Side Stepping/ Forward stepping with band   - 1 x daily - 7 x weekly - 3 sets - 10 reps  PATIENT EDUCATION:  Education details: HEP , symptom management,  Person educated: Patient Education method: Explanation, Demonstration, Tactile cues, Verbal cues, and  Handouts Education comprehension: verbalized understanding, returned demonstration, verbal cues required, tactile cues required, and needs further education  HOME EXERCISE PROGRAM: Access Code: [email or web address] URL: [email or web address] Date: 01/01/2023 Prepared by: Lorayne Bender  ASSESSMENT:  CLINICAL IMPRESSION: Therapy worked on firing of the hamstring today. He had some fatigue but no sharp pains. We will monitor the symptoms and progress as tolerated. We also worked on gross leg strengthening. Her reports he feels like he favors the right with things like squats. He will get some weights for home and begin strengthening on his own. We will montor   Eval:Patient is a 47 year old male who suffered a proximal left hamstring tear approximately 8 weeks prior.  He has felt a progressive improvement in pain since that point.  He enjoys playing tennis, golf and jogging.  He presents with limitations in hamstring length, strength, and general functional mobility.  He has mild tenderness to palpation in the hamstring insertion.  His MRI shows a 30% tear of one of the hamstring tendons and a complete tear of the other with a 3 cm retraction.  He was given a light loading program today.  We will assess his tolerance.  He had no pain with his current exercise program today.  We will progress him as tolerated.  He will benefit from skilled therapy to return to active lifestyle. OBJECTIVE IMPAIRMENTS: decreased activity tolerance, difficulty walking, decreased ROM, decreased strength, impaired flexibility, and pain.   ACTIVITY LIMITATIONS: carrying, bending, standing, stairs, transfers, and locomotion level  PARTICIPATION LIMITATIONS: community activity, occupation, and tennis and golf   PERSONAL FACTORS: None  REHAB POTENTIAL: Excellent  CLINICAL DECISION MAKING: Stable/uncomplicated  EVALUATION COMPLEXITY: Low   GOALS: Goals reviewed with patient? Yes  SHORT TERM GOALS: Target date:  01/28/2023   Patient will demonstrate equal 90/90 hamstring length L V R  Baseline: Goal status: INITIAL  2.  Patient will demonstrate equal left and right LE strength  Baseline:  Goal status: INITIAL  3.  Patient will be independent with base program  Baseline:  Goal status: INITIAL   LONG TERM GOALS: Target date: 02/25/2023    Patient will return to tennis  Baseline:  Goal status: INITIAL  2.  Patient will ambulate community distances without pain and weaknes  Baseline:  Goal status: INITIAL  3.  Patient will return to jogging  Baseline:  Goal status: INITIAL    PLAN:  PT FREQUENCY: 1x/week  PT DURATION: 6 weeks  PLANNED INTERVENTIONS: Therapeutic exercises, Therapeutic activity, Neuromuscular re-education, Balance training, Gait training, Patient/Family education, Self Care, Joint mobilization, Stair training, DME instructions, Aquatic Therapy, Dry Needling, Electrical stimulation, Cryotherapy, Moist heat, Taping, Manual therapy, and Re-evaluation.   PLAN FOR NEXT SESSION:  Review exercises see how patient did.  Consider single-leg stability drills.  Consider cone drill, consider 2 inch eccentric step down, consider exercise ball supine isometric, consider double knee-to-chest with the exercise ball.  Manual therapy to the proximal hamstring if needed.  Progress bridging if tolerated.  Consider leg press if patient is able to tolerate  Dessie Coma, PT 02/03/2023, 4:25 PM

## 2023-02-04 ENCOUNTER — Encounter (HOSPITAL_BASED_OUTPATIENT_CLINIC_OR_DEPARTMENT_OTHER): Payer: Self-pay | Admitting: Physical Therapy

## 2023-02-04 ENCOUNTER — Ambulatory Visit: Payer: BC Managed Care – PPO | Admitting: Orthopaedic Surgery

## 2023-02-10 ENCOUNTER — Encounter (HOSPITAL_BASED_OUTPATIENT_CLINIC_OR_DEPARTMENT_OTHER): Payer: Self-pay | Admitting: Physical Therapy

## 2023-02-10 ENCOUNTER — Ambulatory Visit (HOSPITAL_BASED_OUTPATIENT_CLINIC_OR_DEPARTMENT_OTHER): Payer: BC Managed Care – PPO | Admitting: Physical Therapy

## 2023-02-10 DIAGNOSIS — R2689 Other abnormalities of gait and mobility: Secondary | ICD-10-CM

## 2023-02-10 DIAGNOSIS — M79605 Pain in left leg: Secondary | ICD-10-CM | POA: Diagnosis not present

## 2023-02-10 NOTE — Therapy (Signed)
OUTPATIENT PHYSICAL THERAPY LOWER EXTREMITY EVALUATION   Patient Name: Patrick Horton MRN: [id number] DOB:[birth year], 47 y.o., male Today's Date: 02/10/2023  END OF SESSION:     Past Medical History:  Diagnosis Date   Asthma    Asthma    Family history of long QT syndrome    Hyperlipidemia    Swallowing dysfunction    Past Surgical History:  Procedure Laterality Date   WISDOM TOOTH EXTRACTION     Patient Active Problem List   Diagnosis Date Noted   Left proximal hamstring tendon rupture, initial encounter 11/10/2022   Hypertriglyceridemia 06/26/2016    PCP: Dr Mady Gemma   REFERRING PROVIDER: Dr Gershon Mussel   REFERRING DIAG:  Diagnosis  [phone] (ICD-10-CM) - Left proximal hamstring tendon rupture, initial encounter    THERAPY DIAG:  No diagnosis found.  Rationale for Evaluation and Treatment: Rehabilitation  ONSET DATE: aprox 8 weeks ago   SUBJECTIVE:   SUBJECTIVE STATEMENT: The patient reports the mid belly pain has resolved. He has an occasional pinch up high but otherwise he is doing well. He is walking his dog for a few miles without isssue. No pain at his time.    Eval:Patient was playing tennis approximately 8 weeks ago when he felt a pop in his proximal head of the hamstrings.  He had significant pain at that time.  For a period of time he had difficulty sitting standing and walking.  He is active prior to his injury.  He enjoys tennis and golf.  He also did a light jogging.  He feels like his ability to walk is improved.  He is also sitting better without pain.  Over the past 2 weeks or so he is only experienced about a 1-2 out of 10 pain.  He would like to progress back into normal activity.  He would also like to avoid surgery if able.  PERTINENT HISTORY: Bulging disc in the back  PAIN:  Are you having pain? Yes: NPRS scale: 1-2/10 " a pinch at times"  Pain location: left gluteal  Pain description: dull aching  Aggravating factors: sitting for too  long Relieving factors: changing position   PRECAUTIONS: None  RED FLAGS: None   WEIGHT BEARING RESTRICTIONS: No  FALLS:  Has patient fallen in last 6 months? No  LIVING ENVIRONMENT: No steps into the house  OCCUPATION:  Insurance underwriter: mostly desk work for now  NIKE:   Tennis , golf, [employer]     PLOF: Independent  PATIENT GOALS:   NEXT MD VISIT:    OBJECTIVE:   DIAGNOSTIC FINDINGS:    PATIENT SURVEYS:  FOTO    COGNITION: Overall cognitive status: Within functional limits for tasks assessed     SENSATION: Occasional tingling   EDEMA:    MUSCLE LENGTH: Hamstrings: Right 20 deg from straight 90/90; Left 43 from straight deg   POSTURE: No Significant postural limitations  PALPATION: Mild TTP in the gluteal   LOWER EXTREMITY ROM:  Passive ROM Right eval Left eval  Hip flexion  Within normal limits  Hip extension    Hip abduction    Hip adduction    Hip internal rotation  Within normal limits  Hip external rotation  Within normal limits  Knee flexion    Knee extension    Ankle dorsiflexion    Ankle plantarflexion    Ankle inversion    Ankle eversion     (Blank rows = not tested)  LOWER EXTREMITY MMT:  MMT Right eval Left eval  Hip flexion 40.7 38.8  Hip extension    Hip abduction 45.6 47.7  Hip adduction    Hip internal rotation    Hip external rotation    Knee flexion  Not tested but able to put pressure into therapist hand without pain  Knee extension Within normal limits Within normal limits  Ankle dorsiflexion    Ankle plantarflexion    Ankle inversion    Ankle eversion     (Blank rows = not tested)   FUNCTIONAL TESTS:  Squat   GAIT: No significant gait deviations noted.  Patient does have pain when he walks too far.  TODAY'S TREATMENT:                                                                                                                              DATE:  9/18  Ex bike 3x1  Ex ball Hamstring  iso with cuing not to push into a pinch 2x15  DKTC with care not to irritate hamstring insertion 2x15  Hip extension 2lbs 3x10   Eccentric step down 2 inch 3x10   Cable walk 3x10 25 lbs    Last Visit: Ex bike L8   Ex ball Hamstring iso with cuing not to push into a pinch 2x10  DKTC with care not to irritate hamstring insertion 2x10   Goblet squat 6 lbs in low range 3x15  DB lunge 6 lbs each hand slow static lunge with monitoring of hamstring 2x10   LAQ 3x15 5 lbs     9/4 Manual: Trigger point release to mid belly of medial hamstring.  Trigger point release to proximal hamstring.  Trigger Point Dry-Needling  Treatment instructions: Expect mild to moderate muscle soreness. S/S of pneumothorax if dry needled over a lung field, and to seek immediate medical attention should they occur. Patient verbalized understanding of these instructions and education.  Patient Consent Given: Yes Education handout provided: Yes Muscles treated: Right lateral gastroc using a 0.30 x 50   medial mid belly of hamstring 0.30 X50 2 spots Electrical stimulation performed: No Parameters: N/A Treatment response/outcome: Excellent twitch in all spots, immediate improvement in pain and gastroc  Start drill with low range perturbations 2 x 10 Cone drill 2 x 10 each leg.  Patient advised to keep knee locked continue with the current exercises until progress by therapy   Eval   Exercises - Supine Bridge  - 1 x daily - 7 x weekly - 3 sets - 10 reps - Prone Hip Extension  - 1 x daily - 7 x weekly - 3 sets - 10 reps - Side Stepping/ Forward stepping with band   - 1 x daily - 7 x weekly - 3 sets - 10 reps  PATIENT EDUCATION:  Education details: HEP , symptom management,  Person educated: Patient Education method: Explanation, Demonstration, Tactile cues, Verbal cues, and Handouts Education comprehension: verbalized understanding, returned demonstration, verbal cues required, tactile cues required, and  needs  further education  HOME EXERCISE PROGRAM: Access Code: [email or web address] URL: [email or web address] Date: 01/01/2023 Prepared by: Lorayne Bender  ASSESSMENT:  CLINICAL IMPRESSION: The patient continues to progress well. He had no significant pain with treatment. We will continue to progress exercises as tolerated. We added eccentric step down and the cable drill to increase selective stretss on the hamstring. We will continue to progress as tolerated.   Eval:Patient is a 46 year old male who suffered a proximal left hamstring tear approximately 8 weeks prior.  He has felt a progressive improvement in pain since that point.  He enjoys playing tennis, golf and jogging.  He presents with limitations in hamstring length, strength, and general functional mobility.  He has mild tenderness to palpation in the hamstring insertion.  His MRI shows a 30% tear of one of the hamstring tendons and a complete tear of the other with a 3 cm retraction.  He was given a light loading program today.  We will assess his tolerance.  He had no pain with his current exercise program today.  We will progress him as tolerated.  He will benefit from skilled therapy to return to active lifestyle. OBJECTIVE IMPAIRMENTS: decreased activity tolerance, difficulty walking, decreased ROM, decreased strength, impaired flexibility, and pain.   ACTIVITY LIMITATIONS: carrying, bending, standing, stairs, transfers, and locomotion level  PARTICIPATION LIMITATIONS: community activity, occupation, and tennis and golf   PERSONAL FACTORS: None  REHAB POTENTIAL: Excellent  CLINICAL DECISION MAKING: Stable/uncomplicated  EVALUATION COMPLEXITY: Low   GOALS: Goals reviewed with patient? Yes  SHORT TERM GOALS: Target date: 01/28/2023   Patient will demonstrate equal 90/90 hamstring length L V R  Baseline: Goal status: INITIAL  2.  Patient will demonstrate equal left and right LE strength  Baseline:  Goal status:  INITIAL  3.  Patient will be independent with base program  Baseline:  Goal status: INITIAL   LONG TERM GOALS: Target date: 02/25/2023    Patient will return to tennis  Baseline:  Goal status: INITIAL  2.  Patient will ambulate community distances without pain and weaknes  Baseline:  Goal status: INITIAL  3.  Patient will return to jogging  Baseline:  Goal status: INITIAL    PLAN:  PT FREQUENCY: 1x/week  PT DURATION: 6 weeks  PLANNED INTERVENTIONS: Therapeutic exercises, Therapeutic activity, Neuromuscular re-education, Balance training, Gait training, Patient/Family education, Self Care, Joint mobilization, Stair training, DME instructions, Aquatic Therapy, Dry Needling, Electrical stimulation, Cryotherapy, Moist heat, Taping, Manual therapy, and Re-evaluation.   PLAN FOR NEXT SESSION:  Review exercises see how patient did.  Consider single-leg stability drills.  Consider cone drill, consider 2 inch eccentric step down, consider exercise ball supine isometric, consider double knee-to-chest with the exercise ball.  Manual therapy to the proximal hamstring if needed.  Progress bridging if tolerated.  Consider leg press if patient is able to tolerate  Dessie Coma, PT 02/10/2023, 8:56 AM

## 2023-02-12 ENCOUNTER — Encounter: Payer: Self-pay | Admitting: Orthopaedic Surgery

## 2023-02-12 ENCOUNTER — Ambulatory Visit (INDEPENDENT_AMBULATORY_CARE_PROVIDER_SITE_OTHER): Payer: BC Managed Care – PPO | Admitting: Orthopaedic Surgery

## 2023-02-12 DIAGNOSIS — S76312A Strain of muscle, fascia and tendon of the posterior muscle group at thigh level, left thigh, initial encounter: Secondary | ICD-10-CM | POA: Diagnosis not present

## 2023-02-12 NOTE — Progress Notes (Signed)
Office Visit Note   Patient: Patrick Horton           Date of Birth: 1975-09-23           MRN: [id number] Visit Date: 02/12/2023              Requested by: Richmond Campbell., PA-C [address],  [place] [address] PCP: Richmond Campbell., PA-C   Assessment & Plan: Visit Diagnoses:  1. Left proximal hamstring tendon rupture, initial encounter     Plan: Patrick Horton is now approximately 4 months from a left proximal hamstrings rupture.  Clinically he is doing much better.  He is doing physical therapy once a week.  He is released to activity as tolerated and he is to pay close attention to his symptoms as he increases his activities.  From my standpoint he can follow-up with me as needed.  Questions encouraged and answered.  Follow-Up Instructions: Return if symptoms worsen or fail to improve.   Orders:  No orders of the defined types were placed in this encounter.  No orders of the defined types were placed in this encounter.     Procedures: No procedures performed   Clinical Data: No additional findings.   Subjective: Chief Complaint  Patient presents with   Left Leg - Follow-up    Proximal hamstring rupture    HPI Patrick Horton returns today for follow-up for left proximal hamstrings rupture. Review of Systems  Constitutional: Negative.   HENT: Negative.    Eyes: Negative.   Respiratory: Negative.    Cardiovascular: Negative.   Gastrointestinal: Negative.   Endocrine: Negative.   Genitourinary: Negative.   Skin: Negative.   Allergic/Immunologic: Negative.   Neurological: Negative.   Hematological: Negative.   Psychiatric/Behavioral: Negative.    All other systems reviewed and are negative.    Objective: Vital Signs: There were no vitals taken for this visit.  Physical Exam Vitals and nursing note reviewed.  Constitutional:      Appearance: He is well-developed.  Pulmonary:     Effort: Pulmonary effort is normal.  Abdominal:     Palpations: Abdomen is  soft.  Skin:    General: Skin is warm.  Neurological:     Mental Status: He is alert and oriented to person, place, and time.  Psychiatric:        Behavior: Behavior normal.        Thought Content: Thought content normal.        Judgment: Judgment normal.     Ortho Exam Examination of the left hip is unremarkable.  Normal gait pattern. Specialty Comments:  No specialty comments available.  Imaging: No results found.   PMFS History: Patient Active Problem List   Diagnosis Date Noted   Left proximal hamstring tendon rupture, initial encounter 11/10/2022   Hypertriglyceridemia 06/26/2016   Past Medical History:  Diagnosis Date   Asthma    Asthma    Family history of long QT syndrome    Hyperlipidemia    Swallowing dysfunction     Family History  Problem Relation Age of Onset   Long QT syndrome Mother    Heart attack Maternal Grandfather    Long QT syndrome Brother     Past Surgical History:  Procedure Laterality Date   WISDOM TOOTH EXTRACTION     Social History   Occupational History   Not on file  Tobacco Use   Smoking status: Never   Smokeless tobacco: Never  Substance and Sexual Activity  Alcohol use: Not on file   Drug use: Not on file   Sexual activity: Not on file

## 2023-02-19 ENCOUNTER — Encounter (HOSPITAL_BASED_OUTPATIENT_CLINIC_OR_DEPARTMENT_OTHER): Payer: BC Managed Care – PPO | Admitting: Physical Therapy

## 2023-03-04 ENCOUNTER — Encounter (HOSPITAL_BASED_OUTPATIENT_CLINIC_OR_DEPARTMENT_OTHER): Payer: Self-pay | Admitting: Physical Therapy

## 2023-03-04 ENCOUNTER — Ambulatory Visit (HOSPITAL_BASED_OUTPATIENT_CLINIC_OR_DEPARTMENT_OTHER): Payer: BC Managed Care – PPO | Attending: Orthopaedic Surgery | Admitting: Physical Therapy

## 2023-03-04 DIAGNOSIS — R2689 Other abnormalities of gait and mobility: Secondary | ICD-10-CM | POA: Diagnosis present

## 2023-03-04 DIAGNOSIS — M79605 Pain in left leg: Secondary | ICD-10-CM | POA: Insufficient documentation

## 2023-03-04 NOTE — Therapy (Signed)
OUTPATIENT PHYSICAL THERAPY LOWER EXTREMITY EVALUATION   Patient Name: Patrick Horton MRN: [id number] DOB:1975/12/25, 47 y.o., male Today's Date: 03/04/2023  END OF SESSION:  PT End of Session - 03/04/23 0854     Visit Number 5    Number of Visits 8    Date for PT Re-Evaluation 02/25/23    PT Start Time 0849    PT Stop Time 0930    PT Time Calculation (min) 41 min    Activity Tolerance Patient tolerated treatment well    Behavior During Therapy [facility] for tasks assessed/performed               Past Medical History:  Diagnosis Date   Asthma    Asthma    Family history of long QT syndrome    Hyperlipidemia    Swallowing dysfunction    Past Surgical History:  Procedure Laterality Date   WISDOM TOOTH EXTRACTION     Patient Active Problem List   Diagnosis Date Noted   Left proximal hamstring tendon rupture, initial encounter 11/10/2022   Hypertriglyceridemia 06/26/2016    PCP: Dr Mady Gemma   REFERRING PROVIDER: Dr Gershon Mussel   REFERRING DIAG:  Diagnosis  [phone] (ICD-10-CM) - Left proximal hamstring tendon rupture, initial encounter    THERAPY DIAG:  Pain in left leg  Other abnormalities of gait and mobility  Rationale for Evaluation and Treatment: Rehabilitation  ONSET DATE: aprox 8 weeks ago   SUBJECTIVE:   SUBJECTIVE STATEMENT: The patient is making progress. He has been released by the MD. He is back to running a mile or two with very little pain. The MD advised him not to truent to tennis yet but he can return to golf.   Eval:Patient was playing tennis approximately 8 weeks ago when he felt a pop in his proximal head of the hamstrings.  He had significant pain at that time.  For a period of time he had difficulty sitting standing and walking.  He is active prior to his injury.  He enjoys tennis and golf.  He also did a light jogging.  He feels like his ability to walk is improved.  He is also sitting better without pain.  Over the past 2 weeks or so  he is only experienced about a 1-2 out of 10 pain.  He would like to progress back into normal activity.  He would also like to avoid surgery if able.  PERTINENT HISTORY: Bulging disc in the back  PAIN:  Are you having pain? Yes: NPRS scale: 1-2/10 " a pinch at times"  Pain location: left gluteal  Pain description: dull aching  Aggravating factors: sitting for too long Relieving factors: changing position   PRECAUTIONS: None  RED FLAGS: None   WEIGHT BEARING RESTRICTIONS: No  FALLS:  Has patient fallen in last 6 months? No  LIVING ENVIRONMENT: No steps into the house  OCCUPATION:  Insurance underwriter: mostly desk work for now  NIKE:   Tennis , golf, [employer]     PLOF: Independent  PATIENT GOALS:   NEXT MD VISIT:    OBJECTIVE:   DIAGNOSTIC FINDINGS:    PATIENT SURVEYS:  FOTO    COGNITION: Overall cognitive status: Within functional limits for tasks assessed     SENSATION: Occasional tingling   EDEMA:    MUSCLE LENGTH: Hamstrings: Right 20 deg from straight 90/90; Left 43 from straight deg   POSTURE: No Significant postural limitations  PALPATION: Mild TTP in the gluteal   LOWER EXTREMITY ROM:  Passive ROM Right eval Left eval  Hip flexion  Within normal limits  Hip extension    Hip abduction    Hip adduction    Hip internal rotation  Within normal limits  Hip external rotation  Within normal limits  Knee flexion    Knee extension    Ankle dorsiflexion    Ankle plantarflexion    Ankle inversion    Ankle eversion     (Blank rows = not tested)  LOWER EXTREMITY MMT:  MMT Right eval Left eval  Hip flexion 40.7 38.8  Hip extension    Hip abduction 45.6 47.7  Hip adduction    Hip internal rotation    Hip external rotation    Knee flexion  Not tested but able to put pressure into therapist hand without pain  Knee extension Within normal limits Within normal limits  Ankle dorsiflexion    Ankle plantarflexion    Ankle inversion     Ankle eversion     (Blank rows = not tested)   FUNCTIONAL TESTS:  Squat   GAIT: No significant gait deviations noted.  Patient does have pain when he walks too far.  TODAY'S TREATMENT:                                                                                                                              DATE:  10/10 Exercise bike  Leg press 85 pounds 3 x 15  Running activity: Side shuffle 2 laps Skipping 2 laps Slight shuffle to straight run 2 laps around the cut 2 boxes each direction Changing speeds for labs with rest break in between 2  Reviewed return to running   9/18  Ex bike 3x1  Ex ball Hamstring iso with cuing not to push into a pinch 2x15  DKTC with care not to irritate hamstring insertion 2x15  Hip extension 2lbs 3x10   Eccentric step down 2 inch 3x10   Cable walk 3x10 25 lbs    Last Visit: Ex bike L8   Ex ball Hamstring iso with cuing not to push into a pinch 2x10  DKTC with care not to irritate hamstring insertion 2x10   Goblet squat 6 lbs in low range 3x15  DB lunge 6 lbs each hand slow static lunge with monitoring of hamstring 2x10   LAQ 3x15 5 lbs     9/4 Manual: Trigger point release to mid belly of medial hamstring.  Trigger point release to proximal hamstring.  Trigger Point Dry-Needling  Treatment instructions: Expect mild to moderate muscle soreness. S/S of pneumothorax if dry needled over a lung field, and to seek immediate medical attention should they occur. Patient verbalized understanding of these instructions and education.  Patient Consent Given: Yes Education handout provided: Yes Muscles treated: Right lateral gastroc using a 0.30 x 50   medial mid belly of hamstring 0.30 X50 2 spots Electrical stimulation performed: No Parameters: N/A Treatment response/outcome: Excellent twitch in all spots,  immediate improvement in pain and gastroc  Start drill with low range perturbations 2 x 10 Cone drill 2 x 10 each leg.   Patient advised to keep knee locked continue with the current exercises until progress by therapy   Eval   Exercises - Supine Bridge  - 1 x daily - 7 x weekly - 3 sets - 10 reps - Prone Hip Extension  - 1 x daily - 7 x weekly - 3 sets - 10 reps - Side Stepping/ Forward stepping with band   - 1 x daily - 7 x weekly - 3 sets - 10 reps  PATIENT EDUCATION:  Education details: HEP , symptom management,  Person educated: Patient Education method: Explanation, Demonstration, Tactile cues, Verbal cues, and Handouts Education comprehension: verbalized understanding, returned demonstration, verbal cues required, tactile cues required, and needs further education  HOME EXERCISE PROGRAM: Access Code: [email or web address] URL: [email or web address] Date: 01/01/2023 Prepared by: Lorayne Bender  ASSESSMENT:  CLINICAL IMPRESSION: Patient continues to progress well.  We reviewed exercises that he can use in the future for return to tennis.  He was advised to returning to tennis mount may be better in the spring.  That will give him a full 10 to 12 months to recover from his injury.  He was advised he can do some these light drills and increase the intensity as a goal as long as long as he does not feel anything in the high hamstring area.  He tolerated well.  Therapy updated his HEP for running drills.  Therapy will continue to progress as tolerated.   Eval:Patient is a 46 year old male who suffered a proximal left hamstring tear approximately 8 weeks prior.  He has felt a progressive improvement in pain since that point.  He enjoys playing tennis, golf and jogging.  He presents with limitations in hamstring length, strength, and general functional mobility.  He has mild tenderness to palpation in the hamstring insertion.  His MRI shows a 30% tear of one of the hamstring tendons and a complete tear of the other with a 3 cm retraction.  He was given a light loading program today.  We will assess his  tolerance.  He had no pain with his current exercise program today.  We will progress him as tolerated.  He will benefit from skilled therapy to return to active lifestyle. OBJECTIVE IMPAIRMENTS: decreased activity tolerance, difficulty walking, decreased ROM, decreased strength, impaired flexibility, and pain.   ACTIVITY LIMITATIONS: carrying, bending, standing, stairs, transfers, and locomotion level  PARTICIPATION LIMITATIONS: community activity, occupation, and tennis and golf   PERSONAL FACTORS: None  REHAB POTENTIAL: Excellent  CLINICAL DECISION MAKING: Stable/uncomplicated  EVALUATION COMPLEXITY: Low   GOALS: Goals reviewed with patient? Yes  SHORT TERM GOALS: Target date: 01/28/2023   Patient will demonstrate equal 90/90 hamstring length L V R  Baseline: Goal status: INITIAL  2.  Patient will demonstrate equal left and right LE strength  Baseline:  Goal status: INITIAL  3.  Patient will be independent with base program  Baseline:  Goal status: INITIAL   LONG TERM GOALS: Target date: 02/25/2023    Patient will return to tennis  Baseline:  Goal status: INITIAL  2.  Patient will ambulate community distances without pain and weaknes  Baseline:  Goal status: INITIAL  3.  Patient will return to jogging  Baseline:  Goal status: INITIAL    PLAN:  PT FREQUENCY: 1x/week  PT DURATION: 6 weeks  PLANNED INTERVENTIONS: Therapeutic exercises, Therapeutic activity, Neuromuscular re-education,  Balance training, Gait training, Patient/Family education, Self Care, Joint mobilization, Stair training, DME instructions, Aquatic Therapy, Dry Needling, Electrical stimulation, Cryotherapy, Moist heat, Taping, Manual therapy, and Re-evaluation.   PLAN FOR NEXT SESSION:  Review exercises see how patient did.  Consider single-leg stability drills.  Consider cone drill, consider 2 inch eccentric step down, consider exercise ball supine isometric, consider double knee-to-chest  with the exercise ball.  Manual therapy to the proximal hamstring if needed.  Progress bridging if tolerated.  Consider leg press if patient is able to tolerate  Dessie Coma, PT 03/04/2023, 9:08 AM

## 2023-03-11 ENCOUNTER — Encounter (HOSPITAL_BASED_OUTPATIENT_CLINIC_OR_DEPARTMENT_OTHER): Payer: BC Managed Care – PPO | Admitting: Physical Therapy

## 2023-11-19 ENCOUNTER — Emergency Department (HOSPITAL_BASED_OUTPATIENT_CLINIC_OR_DEPARTMENT_OTHER)
Admission: EM | Admit: 2023-11-19 | Discharge: 2023-11-19 | Disposition: A | Discharge status: Home / Self Care | Attending: Emergency Medicine | Admitting: Emergency Medicine

## 2023-11-19 ENCOUNTER — Emergency Department (HOSPITAL_BASED_OUTPATIENT_CLINIC_OR_DEPARTMENT_OTHER): Admitting: Radiology

## 2023-11-19 ENCOUNTER — Encounter (HOSPITAL_BASED_OUTPATIENT_CLINIC_OR_DEPARTMENT_OTHER): Payer: Self-pay

## 2023-11-19 DIAGNOSIS — R0789 Other chest pain: Secondary | ICD-10-CM | POA: Insufficient documentation

## 2023-11-19 DIAGNOSIS — J45909 Unspecified asthma, uncomplicated: Secondary | ICD-10-CM | POA: Diagnosis not present

## 2023-11-19 DIAGNOSIS — R079 Chest pain, unspecified: Secondary | ICD-10-CM | POA: Diagnosis present

## 2023-11-19 LAB — CBC
HCT: 44.3 % (ref 39.0–52.0)
Hemoglobin: 15.6 g/dL (ref 13.0–17.0)
MCH: 30.5 pg (ref 26.0–34.0)
MCHC: 35.2 g/dL (ref 30.0–36.0)
MCV: 86.5 fL (ref 80.0–100.0)
Platelets: 271 10*3/uL (ref 150–400)
RBC: 5.12 MIL/uL (ref 4.22–5.81)
RDW: 12.2 % (ref 11.5–15.5)
WBC: 9 10*3/uL (ref 4.0–10.5)
nRBC: 0 % (ref 0.0–0.2)

## 2023-11-19 LAB — BASIC METABOLIC PANEL WITH GFR
Anion gap: 14 (ref 5–15)
BUN: 15 mg/dL (ref 6–20)
CO2: 23 mmol/L (ref 22–32)
Calcium: 10.2 mg/dL (ref 8.9–10.3)
Chloride: 101 mmol/L (ref 98–111)
Creatinine, Ser: 1.07 mg/dL (ref 0.61–1.24)
GFR, Estimated: >60 mL/min (ref >60)
Glucose, Bld: 124 mg/dL — ABNORMAL HIGH (ref 70–99)
Potassium: 3.8 mmol/L (ref 3.5–5.1)
Sodium: 139 mmol/L (ref 135–145)

## 2023-11-19 LAB — TROPONIN T, HIGH SENSITIVITY
Troponin T High Sensitivity: <15 ng/L (ref <19)
Troponin T High Sensitivity: <15 ng/L (ref <19)

## 2023-11-19 LAB — D-DIMER, QUANTITATIVE: D-Dimer, Quant: <0.27 ug{FEU}/mL (ref 0.00–0.50)

## 2023-11-19 NOTE — ED Triage Notes (Signed)
 Pt c/o CP, lightheaded, tingling in L arm. Issues in the last 3wks, similar but less lightheaded- I feel like I'm going to pass out. Seen at PCP yesterday for EKG (normal), bloodwork normal, virtual appt w cardiologist Monday  Dry mouth, a little nausea but denies [facility]

## 2023-11-19 NOTE — Discharge Instructions (Signed)
 You are seen in the emergency department today with concerns of chest pain.  Your labs and imaging were thankfully reassuring without any obvious abnormality seen.  I would recommend you follow-up with your primary care provider and cardiologist for further evaluation.  For any concerns of new or worsening symptoms, return to the emergency department immediately.

## 2023-11-19 NOTE — ED Provider Notes (Signed)
 [place] EMERGENCY DEPARTMENT AT [facility] Provider Note   CSN: [id number] Arrival date & time: 11/19/23  1728     Patient presents with: No chief complaint on file.   Patrick Horton is a 48 y.o. male.  Patient with past history significant for asthma presents the emergency department concerns of chest pain.  Reports has been having intermittent episodes of chest pain lightheadedness as well as tingling left arm for the last 3 weeks.  He endorses feelings that he could pass out but has not had any syncopal episode.  Was seen by his PCP yesterday with unremarkable workup include EKG, blood work, and has an appointment with cardiology scheduled for Monday.   HPI     Prior to Admission medications   Not on File    Allergies: Penicillins    Review of Systems  Cardiovascular:  Positive for chest pain.  All other systems reviewed and are negative.   Updated Vital Signs BP 123/88   Pulse 72   Temp 98.5 F (36.9 C) (Oral)   Resp 16   SpO2 98%   Physical Exam Vitals and nursing note reviewed.  Constitutional:      General: He is not in acute distress.    Appearance: He is well-developed.  HENT:     Head: Normocephalic and atraumatic.   Eyes:     Conjunctiva/sclera: Conjunctivae normal.    Cardiovascular:     Rate and Rhythm: Normal rate and regular rhythm.     Heart sounds: No murmur heard. Pulmonary:     Effort: Pulmonary effort is normal. No respiratory distress.     Breath sounds: Normal breath sounds.  Abdominal:     Palpations: Abdomen is soft.     Tenderness: There is no abdominal tenderness.   Musculoskeletal:        General: No swelling.     Cervical back: Neck supple.     Right lower leg: No edema.     Left lower leg: No edema.   Skin:    General: Skin is warm and dry.     Capillary Refill: Capillary refill takes less than 2 seconds.   Neurological:     Mental Status: He is alert.   Psychiatric:        Mood and Affect: Mood normal.      (all labs ordered are listed, but only abnormal results are displayed) Labs Reviewed  BASIC METABOLIC PANEL WITH GFR - Abnormal; Notable for the following components:      Result Value   Glucose, Bld 124 (*)    All other components within normal limits  CBC  D-DIMER, QUANTITATIVE  TROPONIN T, HIGH SENSITIVITY  TROPONIN T, HIGH SENSITIVITY    EKG: EKG Interpretation Date/Time:  Friday November 19 2023 17:34:31 EDT Ventricular Rate:  73 PR Interval:  156 QRS Duration:  90 QT Interval:  364 QTC Calculation: 401 R Axis:   37  Text Interpretation: Normal sinus rhythm Normal ECG No previous ECGs available Confirmed by Zackowski, Scott [phone]) on 11/19/2023 5:36:53 PM  Radiology: [facility] Chest 2 View Result Date: 11/19/2023 CLINICAL DATA:  Chest pain. EXAM: CHEST - 2 VIEW COMPARISON:  03/21/2018 FINDINGS: The heart size and mediastinal contours are within normal limits. Both lungs are clear. The visualized skeletal structures are unremarkable. IMPRESSION: No active cardiopulmonary disease. Electronically Signed   By: Norleen DELENA Kil M.D.   On: 11/19/2023 18:32     Procedures   Medications Ordered in the ED - No data to  display                                  Medical Decision Making Amount and/or Complexity of Data Reviewed Labs: ordered. Radiology: ordered.   This patient presents to the ED for concern of chest pain.  Differential diagnosis includes ACS, PE, pneumonia, bronchitis, arrhythmia   Lab Tests:  I Ordered, and personally interpreted labs.  The pertinent results include: CBC unremarkable, BMP with mild hyperglycemia 124, troponin negative at less than 15 with all troponin remaining negative, D-dimer less than 0.27   Imaging Studies ordered:  I ordered imaging studies including chest x-ray I independently visualized and interpreted imaging which showed unremarkable I agree with the radiologist interpretation   Problem List / ED Course:  Patient presents to the  emergency department concerns of chest pain.  He reports intermittent episodes of chest pain that feel like a pressure/tightness feeling towards his left chest.  No prior history of ACS or any cardiac abnormalities.  He was evaluated by his PCP earlier this week with unremarkable EKG and normal labs.  He has a referral to cardiology and follow-up scheduled for early next week. On exam, patient has no significant findings.  No abnormal heart or lung sounds.  No appreciable heart murmurs.  No reproducible chest wall tenderness.  Will proceed with cardiac workup for evaluation possible source of symptoms. Workup is unremarkable.  Troponin negative with repeat troponin remaining negative.  D-dimer negative.  Doubt ACS or PE.  Without reproducible chest wall tenderness, unsure of possible MSK source.  Given patient's reported near syncopal type sensations, I am somewhat concerned that this is an atypical presentation.  Heart score of 2. With unremarkable workup and low risk heart score, advised patient to follow-up closely with his cardiologist.  Referral to cardiology placed on RN to help expedite any possible need for evaluation if he is unable to see his cardiologist.  No other acute focal concerns at this time.  Discussed return precautions such as new or worsening symptoms or true syncopal episode.  Patient verbalized understanding return precautions.  Discharged home in stable condition.   Final diagnoses:  Atypical chest pain    ED Discharge Orders          Ordered    Ambulatory referral to Cardiology       Comments: If you have not heard from the Cardiology office within the next 72 hours please call [phone].   11/19/23 2350               Cecily Legrand LABOR, PA-C 11/20/23 0017    Zackowski, Scott, MD 11/22/23 [phone]

## 2023-11-19 NOTE — ED Provider Notes (Incomplete)
  [place] EMERGENCY DEPARTMENT AT [facility] Provider Note   CSN: [id number] Arrival date & time: 11/19/23  1728     Patient presents with: No chief complaint on file.   Patrick Horton is a 48 y.o. male.  Patient with past history significant for asthma presents the emergency department concerns of chest pain.  Reports has been having intermittent episodes of chest pain lightheadedness as well as tingling left arm for the last 3 weeks.  He endorses feelings that he could pass out but has not had any syncopal episode.  Was seen by his PCP yesterday with unremarkable workup include EKG, blood work, and has an appointment with cardiology scheduled for Monday.   HPI     Prior to Admission medications   Not on File    Allergies: Penicillins    Review of Systems  Updated Vital Signs BP (!) 134/96 (BP Location: Right Arm)   Pulse 65   Temp 98.5 F (36.9 C) (Oral)   Resp 16   SpO2 99%   Physical Exam  (all labs ordered are listed, but only abnormal results are displayed) Labs Reviewed  BASIC METABOLIC PANEL WITH GFR - Abnormal; Notable for the following components:      Result Value   Glucose, Bld 124 (*)    All other components within normal limits  CBC  D-DIMER, QUANTITATIVE  TROPONIN T, HIGH SENSITIVITY  TROPONIN T, HIGH SENSITIVITY    EKG: EKG Interpretation Date/Time:  Friday November 19 2023 17:34:31 EDT Ventricular Rate:  73 PR Interval:  156 QRS Duration:  90 QT Interval:  364 QTC Calculation: 401 R Axis:   37  Text Interpretation: Normal sinus rhythm Normal ECG No previous ECGs available Confirmed by Zackowski, Scott [phone]) on 11/19/2023 5:36:53 PM  Radiology: [facility] Chest 2 View Result Date: 11/19/2023 CLINICAL DATA:  Chest pain. EXAM: CHEST - 2 VIEW COMPARISON:  03/21/2018 FINDINGS: The heart size and mediastinal contours are within normal limits. Both lungs are clear. The visualized skeletal structures are unremarkable. IMPRESSION: No active  cardiopulmonary disease. Electronically Signed   By: Norleen DELENA Kil M.D.   On: 11/19/2023 18:32    {Document cardiac monitor, telemetry assessment procedure when appropriate:32947} Procedures   Medications Ordered in the ED - No data to display    {Click here for ABCD2, HEART and other calculators REFRESH Note before signing:1}                              Medical Decision Making Amount and/or Complexity of Data Reviewed Labs: ordered. Radiology: ordered.   ***  {Document critical care time when appropriate  Document review of labs and clinical decision tools ie [id number], etc  Document your independent review of radiology images and any outside records  Document your discussion with family members, caretakers and with consultants  Document social determinants of health affecting pt's care  Document your decision making why or why not admission, treatments were needed:32947:::1}   Final diagnoses:  None    ED Discharge Orders     None

## 2024-05-04 ENCOUNTER — Encounter (HOSPITAL_BASED_OUTPATIENT_CLINIC_OR_DEPARTMENT_OTHER): Payer: Self-pay
